# Patient Record
Sex: Female | Born: 1985 | Race: White | Hispanic: No | Marital: Single | Smoking: Current every day smoker
Health system: Southern US, Community
[De-identification: ages and names within clinical notes are randomized; demographics above are authoritative.]

## PROBLEM LIST (undated history)

## (undated) HISTORY — PX: TONSILLECTOMY: SUR1361

---
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
## 2007-04-10 ENCOUNTER — Emergency Department (HOSPITAL_COMMUNITY): Admission: EM | Admit: 2007-04-10 | Discharge: 2007-04-10 | Payer: Self-pay | Admitting: Emergency Medicine

## 2009-11-10 ENCOUNTER — Emergency Department (HOSPITAL_COMMUNITY): Admission: EM | Admit: 2009-11-10 | Discharge: 2009-11-10 | Payer: Self-pay | Admitting: Emergency Medicine

## 2010-05-11 ENCOUNTER — Emergency Department (HOSPITAL_COMMUNITY)
Admission: EM | Admit: 2010-05-11 | Discharge: 2010-05-11 | Disposition: A | Discharge status: Home / Self Care | Payer: Self-pay | Attending: Emergency Medicine | Admitting: Emergency Medicine

## 2010-05-11 ENCOUNTER — Emergency Department (HOSPITAL_COMMUNITY): Payer: Self-pay

## 2010-05-11 DIAGNOSIS — Y92009 Unspecified place in unspecified non-institutional (private) residence as the place of occurrence of the external cause: Secondary | ICD-10-CM | POA: Insufficient documentation

## 2010-05-11 DIAGNOSIS — M25529 Pain in unspecified elbow: Secondary | ICD-10-CM | POA: Insufficient documentation

## 2010-05-11 DIAGNOSIS — M25429 Effusion, unspecified elbow: Secondary | ICD-10-CM | POA: Insufficient documentation

## 2010-05-11 DIAGNOSIS — S52123A Displaced fracture of head of unspecified radius, initial encounter for closed fracture: Secondary | ICD-10-CM | POA: Insufficient documentation

## 2010-05-11 DIAGNOSIS — X500XXA Overexertion from strenuous movement or load, initial encounter: Secondary | ICD-10-CM | POA: Insufficient documentation

## 2011-10-10 IMAGING — CR DG ELBOW COMPLETE 3+V*R*
4 series · 4 of 4 positions shown · non-contrast
Comparison: None.

CLINICAL DATA: Trauma to right elbow.  Pain.  Remote history of
fracture.

RIGHT ELBOW - COMPLETE 3+ VIEW

[x elbow joint ap right]
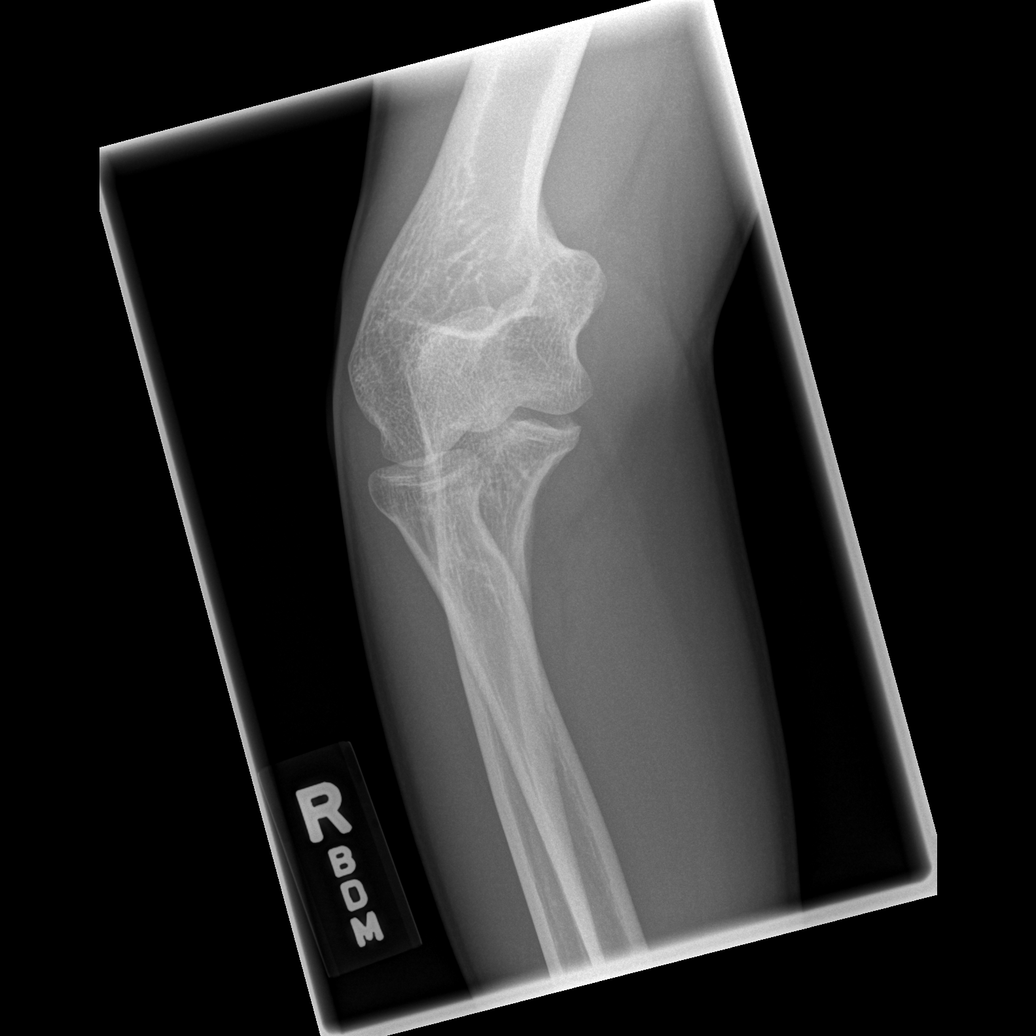

[x elbow joint obl. right (1 of 2)]
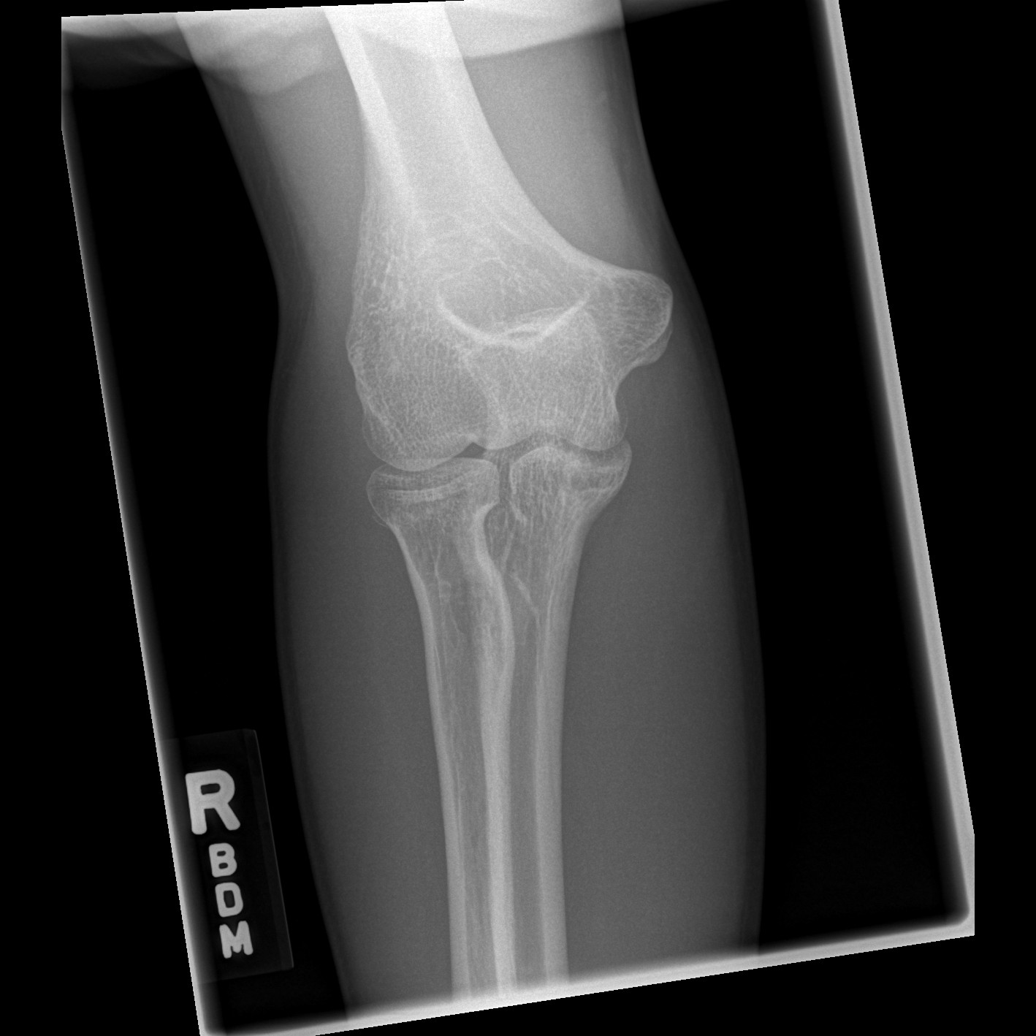

[x elbow joint obl. right (2 of 2)]
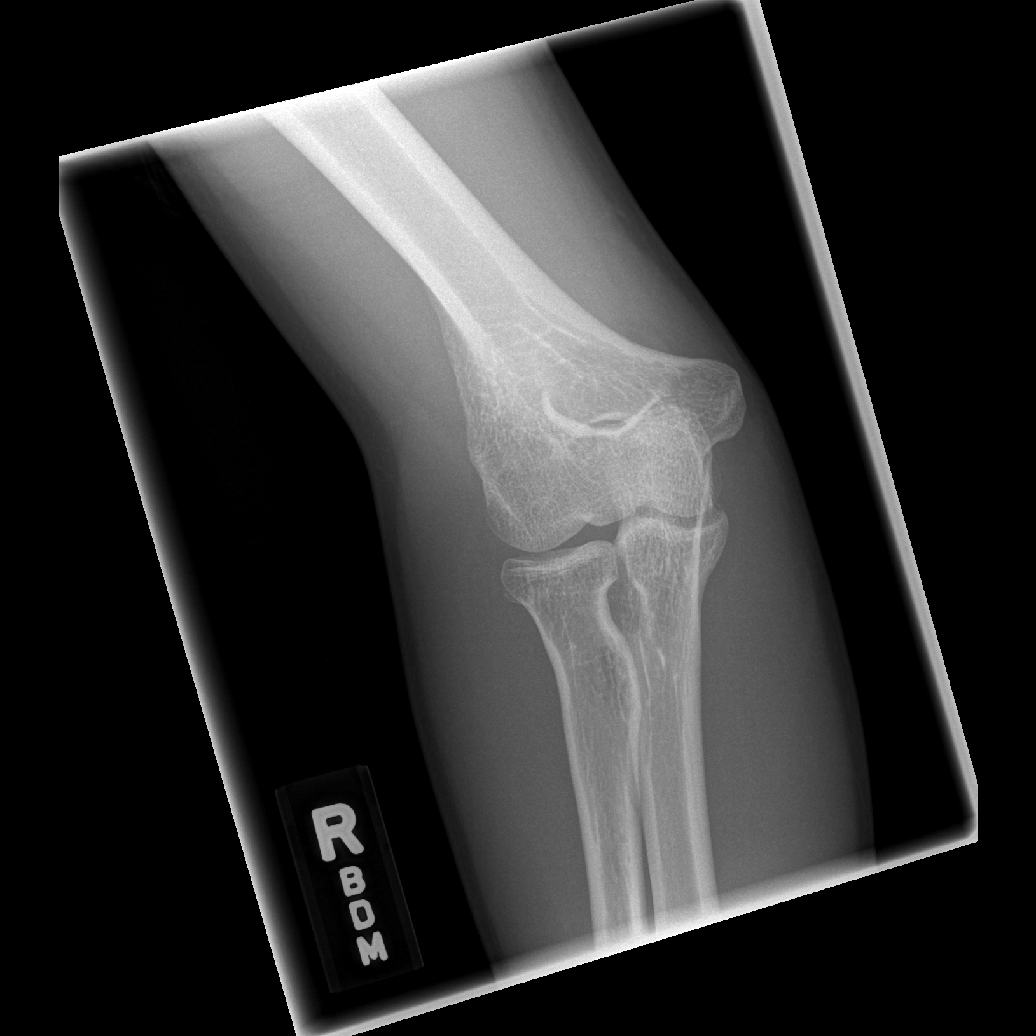

[x elbow joint lat right]
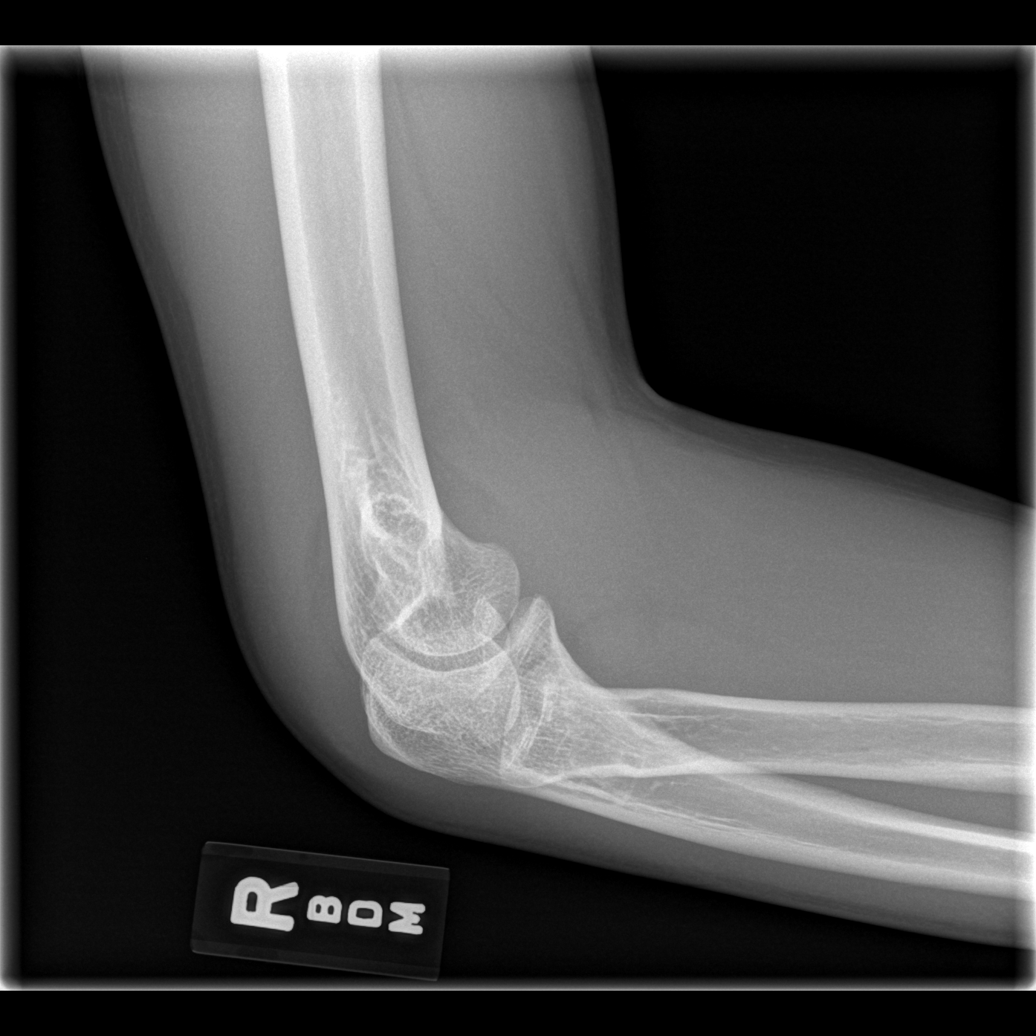

[4 of 4 positions shown; findings below may reference images not displayed]

FINDINGS: Soft tissue swelling is present.  Dorsal to the distal
humerus.  An elbow effusion is present.  A minimally-displaced
radial head fracture is suspected.
IMPRESSION: 1.  Suspect minimally-displaced radial head fracture.
2.  Moderate joint effusion.

## 2011-11-16 ENCOUNTER — Emergency Department (HOSPITAL_COMMUNITY)
Admission: EM | Admit: 2011-11-16 | Discharge: 2011-11-16 | Disposition: A | Discharge status: Home / Self Care | Payer: BC Managed Care – PPO | Source: Home / Self Care | Attending: Family Medicine | Admitting: Family Medicine

## 2011-11-16 ENCOUNTER — Encounter (HOSPITAL_COMMUNITY): Payer: Self-pay | Admitting: Emergency Medicine

## 2011-11-16 DIAGNOSIS — L02212 Cutaneous abscess of back [any part, except buttock]: Secondary | ICD-10-CM

## 2011-11-16 DIAGNOSIS — L02219 Cutaneous abscess of trunk, unspecified: Secondary | ICD-10-CM

## 2011-11-16 MED ORDER — IBUPROFEN 600 MG PO TABS: 600.0000 mg | ORAL_TABLET | Freq: Three times a day (TID) | ORAL | Status: AC

## 2011-11-16 MED ORDER — DOXYCYCLINE HYCLATE 100 MG PO CAPS: 100.0000 mg | ORAL_CAPSULE | Freq: Two times a day (BID) | ORAL | Status: AC

## 2011-11-16 NOTE — ED Provider Notes (Signed)
History     CSN: [id number]  Arrival date & time 11/16/11  1154   First MD Initiated Contact with Patient 11/16/11 1237      Chief Complaint  Patient presents with  . Insect Bite    lower back    (Consider location/radiation/quality/duration/timing/severity/associated sxs/prior treatment) HPI Comments: 26 year old female smoker nondiabetic. Here complaining of an insect bite in her left lower back over 2 weeks ago, patient has been frequently scratching and the afected area has progressively become tender and swollen. Denies fever or chills. No spontaneous drainage.   History reviewed. No pertinent past medical history.  Past Surgical History  Procedure Date  . Tonsillectomy     History reviewed. No pertinent family history.  History  Substance Use Topics  . Smoking status: Current Every Day Smoker -- 1.0 packs/day  . Smokeless tobacco: Not on file  . Alcohol Use: Yes     occaisional    OB History    Grav Para Term Preterm Abortions TAB SAB Ect Mult Living                  Review of Systems  Constitutional: Negative for fever and chills.       10 systems reviewed and  pertinent negative and positive symptoms are as per HPI.     Skin:       As per HPI  All other systems reviewed and are negative.    Allergies  Review of patient's allergies indicates no known allergies.  Home Medications   Current Outpatient Rx  Name Route Sig Dispense Refill  . DOXYCYCLINE HYCLATE 100 MG PO CAPS Oral Take 1 capsule (100 mg total) by mouth 2 (two) times daily. 20 capsule 0  . IBUPROFEN 600 MG PO TABS Oral Take 1 tablet (600 mg total) by mouth 3 (three) times daily. 20 tablet 0    BP 103/70  Pulse 77  Temp 99.1 F (37.3 C) (Oral)  Resp 16  SpO2 100%  LMP 11/15/2011  Physical Exam  Nursing note and vitals reviewed. Constitutional: She is oriented to person, place, and time. She appears well-developed and well-nourished. No distress.  HENT:  Head: Normocephalic  and atraumatic.  Eyes: Conjunctivae normal are normal.  Neck: Neck supple.  Cardiovascular: Normal rate, regular rhythm and normal heart sounds.   Pulmonary/Chest: Breath sounds normal.  Lymphadenopathy:    She has no cervical adenopathy.  Neurological: She is alert and oriented to person, place, and time.  Skin: She is not diaphoretic.       Round area of about 3 cm diameter with focal erythema, tenderness, induration and swelling at left lower back above left gluteal area. No significant fluctuation. No opening, pustules or spontaneous drainage. No significant associated cellulitis.    ED Course  INCISION AND DRAINAGE Performed by: Sharin Grave Authorized by: Sharin Grave Consent: Verbal consent obtained. Risks and benefits: risks, benefits and alternatives were discussed Consent given by: patient Patient understanding: patient states understanding of the procedure being performed Patient consent: the patient's understanding of the procedure matches consent given Type: abscess Body area: trunk Location details: back Anesthesia: local infiltration Local anesthetic: lidocaine 1% with epinephrine Scalpel size: 11 Incision type: single straight Complexity: simple Drainage: purulent Drainage amount: scant Patient tolerance: Patient tolerated the procedure well with no immediate complications. Comments: Antibiotic ointment and dry dressing applied. Wound culture pending   (including critical care time)   Labs Reviewed  CULTURE, ROUTINE-ABSCESS   No results found.   1. Abscess  of lower back       MDM  Small skin abscess in the right lower back status post incision and drainage today. Wound care instructions discussed with patient and provided in writing. Prescribe doxycycline and ibuprofen. Wound culture pending. Asked to return if persistent or worsening symptoms despite following treatment.        Sharin Grave, MD 11/19/11 [phone]

## 2011-11-16 NOTE — ED Notes (Signed)
Pt c/o insect bite to lower back noticed on Aug. 24 area had healed at one point but then it started to itch and cut area open with scratching.  Pt states red,hard,and swollen.

## 2011-11-19 LAB — CULTURE, ROUTINE-ABSCESS

## 2011-11-21 ENCOUNTER — Telehealth (HOSPITAL_COMMUNITY): Payer: Self-pay | Admitting: *Deleted

## 2011-11-21 NOTE — ED Notes (Signed)
Pt. called back.  Pt. verified x 2 and given results.  Pt. told she was adequately treated with Doxycycline.  Pt. given MRSA instructions.  Pt.'s questions answered. (No she does not have to tell her employer.  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

## 2011-11-21 NOTE — ED Notes (Signed)
Abscess culture L lower back: Mod. MRSA.  Pt. adequately treated with Doxycycline. I called and left a message for pt. to call. Vassie Moselle 11/21/2011

## 2011-12-26 ENCOUNTER — Emergency Department (HOSPITAL_COMMUNITY)
Admission: EM | Admit: 2011-12-26 | Discharge: 2011-12-26 | Disposition: A | Discharge status: Home / Self Care | Payer: BC Managed Care – PPO | Source: Home / Self Care

## 2011-12-26 ENCOUNTER — Encounter (HOSPITAL_COMMUNITY): Payer: Self-pay | Admitting: Emergency Medicine

## 2011-12-26 DIAGNOSIS — J029 Acute pharyngitis, unspecified: Secondary | ICD-10-CM

## 2011-12-26 DIAGNOSIS — B349 Viral infection, unspecified: Secondary | ICD-10-CM

## 2011-12-26 DIAGNOSIS — J069 Acute upper respiratory infection, unspecified: Secondary | ICD-10-CM

## 2011-12-26 DIAGNOSIS — B9789 Other viral agents as the cause of diseases classified elsewhere: Secondary | ICD-10-CM

## 2011-12-26 NOTE — ED Provider Notes (Signed)
History     CSN: [id number]  Arrival date & time 12/26/11  6962   None     Chief Complaint  Patient presents with  . Sore Throat    (Consider location/radiation/quality/duration/timing/severity/associated sxs/prior treatment) HPI Comments: Refill for presents with sore throat progressing over 2 days. He started at a scratchy throat in turning to the painful throat. Is associated with nasal congestion and having to blow her nose frequently. She has a sensation of her ears popping associated with of nasal congestion. She also has nausea but no vomiting and a decrease in appetite.  Patient is a 26 y.o. female presenting with pharyngitis. The history is provided by the patient.  Sore Throat This is a new problem. The current episode started 2 days ago. The problem occurs constantly. The problem has been gradually worsening. Pertinent negatives include no chest pain, no abdominal pain, no headaches and no shortness of breath. The symptoms are relieved by NSAIDs.    History reviewed. No pertinent past medical history.  Past Surgical History  Procedure Date  . Tonsillectomy     No family history on file.  History  Substance Use Topics  . Smoking status: Current Every Day Smoker -- 0.5 packs/day  . Smokeless tobacco: Not on file  . Alcohol Use: Yes     occaisional    OB History    Grav Para Term Preterm Abortions TAB SAB Ect Mult Living                  Review of Systems  Constitutional: Negative for fever, chills, activity change, appetite change and fatigue.  HENT: Positive for congestion, sore throat, rhinorrhea and postnasal drip. Negative for facial swelling, neck pain, neck stiffness and ear discharge.   Eyes: Negative.  Negative for pain.  Respiratory: Positive for cough. Negative for chest tightness and shortness of breath.   Cardiovascular: Negative.  Negative for chest pain.  Gastrointestinal: Positive for nausea. Negative for abdominal pain.  Genitourinary:  Negative.   Skin: Negative for pallor and rash.  Neurological: Negative.  Negative for headaches.    Allergies  Review of patient's allergies indicates no known allergies.  Home Medications  No current outpatient prescriptions on file.  BP 116/67  Pulse 109  Temp 100.7 F (38.2 C) (Oral)  Resp 18  SpO2 96%  LMP 12/17/2011  Physical Exam  Constitutional: She is oriented to person, place, and time. She appears well-developed and well-nourished. No distress.  HENT:  Right Ear: External ear normal.  Left Ear: External ear normal.       Oropharynx is erythematous with cobblestoning. No exudates observed  Eyes: Conjunctivae normal and EOM are normal.  Neck: Normal range of motion. Neck supple.  Cardiovascular: Normal rate and regular rhythm.   Pulmonary/Chest: Effort normal and breath sounds normal. No respiratory distress. She has no wheezes. She has no rales.  Musculoskeletal: Normal range of motion. She exhibits no edema.  Lymphadenopathy:    She has no cervical adenopathy.  Neurological: She is alert and oriented to person, place, and time.  Skin: Skin is warm and dry. No rash noted.  Psychiatric: She has a normal mood and affect.    ED Course  Procedures (including critical care time)   Labs Reviewed  POCT RAPID STREP A ([facility] ONLY)   No results found.   1. Acute viral pharyngitis   2. Viral syndrome   3. URI (upper respiratory infection)       MDM  Results for orders placed during the hospital encounter of 12/26/11  POCT RAPID STREP A ([facility] ONLY)      Component Value Range   Streptococcus, Group A Screen (Direct) NEGATIVE  NEGATIVE   Drink plenty of fluids and stay well hydrated Ibuprofen 600-800 mg Q8 hours when necessary sore throat pain and fever. Cepacol lozenges when necessary sore throat For any worsening new symptoms or problems may return or follow up with your PCP.        Hayden Rasmussen, NP 12/26/11 [phone]

## 2011-12-26 NOTE — ED Notes (Addendum)
Pt c/o cold sx x2 days.... Sx include: sore throat, body aches, fevers, nauseas, dry cough, dysphagia... Denies: vomiting, diarrhea.... Pt is alert w/no signs of distress and sitting up straight on the exam table.

## 2011-12-27 NOTE — ED Provider Notes (Signed)
Medical screening examination/treatment/procedure(s) were performed by resident physician or non-physician practitioner and as supervising physician I was immediately available for consultation/collaboration.   Barkley Bruns MD.    Linna Hoff, MD 12/27/11 2107

## 2013-05-29 ENCOUNTER — Ambulatory Visit (INDEPENDENT_AMBULATORY_CARE_PROVIDER_SITE_OTHER): Payer: BC Managed Care – PPO | Admitting: Surgery

## 2013-05-29 ENCOUNTER — Telehealth (INDEPENDENT_AMBULATORY_CARE_PROVIDER_SITE_OTHER): Payer: Self-pay | Admitting: Surgery

## 2013-05-29 ENCOUNTER — Encounter (INDEPENDENT_AMBULATORY_CARE_PROVIDER_SITE_OTHER): Payer: Self-pay | Admitting: Surgery

## 2013-05-29 VITALS — BP 124/74 | HR 77 | Temp 98.7°F | Resp 16 | Ht 69.0 in | Wt 138.4 lb

## 2013-05-29 DIAGNOSIS — M7989 Other specified soft tissue disorders: Secondary | ICD-10-CM | POA: Insufficient documentation

## 2013-05-29 DIAGNOSIS — M799 Soft tissue disorder, unspecified: Secondary | ICD-10-CM

## 2013-05-29 NOTE — Progress Notes (Signed)
General Surgery [facility]  Chief Complaint  Patient presents with  . New Evaluation    possible lipoma on right inner thigh - referral from Dr. Teodora MediciHoward Mezer    HISTORY: Patient is a 28 year old female referred by her gynecologist for evaluation of a right thigh mass. Patient has a soft tissue mass at the upper portion of the right medial thigh which has been present for approximately 3 years. It has not changed significantly in size. It does not cause any significant discomfort. There is been no signs or symptoms of infection. Patient presents today for evaluation and wishes surgical removal.  Patient has had no other such lesions removed. She denies any history of trauma.  History reviewed. No pertinent past medical history.  No current outpatient prescriptions on file.   No current facility-administered medications for this visit.    No Known Allergies  History reviewed. No pertinent family history.  History   Social History  . Marital Status: Single    Spouse Name: N/A    Number of Children: N/A  . Years of Education: N/A   Social History Main Topics  . Smoking status: Current Every Day Smoker -- 0.50 packs/day  . Smokeless tobacco: None  . Alcohol Use: Yes     Comment: occaisional  . Drug Use: No  . Sexual Activity: Yes    Birth Control/ Protection: Condom   Other Topics Concern  . None   Social History Narrative  . None    REVIEW OF SYSTEMS - PERTINENT POSITIVES ONLY: No pain. No signs or symptoms of infection. No other similar lesions.  EXAM: Filed Vitals:   05/29/13 1415  BP: 124/74  Pulse: 77  Temp: 98.7 F (37.1 C)  Resp: 16    GENERAL: well-developed, well-nourished, no acute distress HEENT: normocephalic; pupils equal and reactive; sclerae clear; dentition good; mucous membranes moist NECK:  symmetric on extension; no palpable anterior or posterior cervical lymphadenopathy; no supraclavicular masses; no  tenderness CHEST: clear to auscultation bilaterally without rales, rhonchi, or wheezes CARDIAC: regular rate and rhythm without significant murmur; peripheral pulses are full EXT:  non-tender without edema; no deformity; soft tissue mass, 4 cm x 2 cm x 2 cm, upper right medial thigh, mobile, firm NEURO: no gross focal deficits; no sign of tremor   LABORATORY RESULTS: See [facility] ([facility]) for most recent results  RADIOLOGY RESULTS: See [facility] ([facility]pic) for most recent results  IMPRESSION: 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 I discussed surgical excision. We discussed the risk and benefits of the procedure and the postoperative wound care. She understands and wishes to proceed with outpatient surgery in the near future.  The risks and benefits of the procedure have been discussed at length with the patient.  The patient understands the proposed procedure, potential alternative treatments, and the course of recovery to be expected.  All of the patient's questions have been answered at this time.  The patient wishes to proceed with surgery.  Velora Hecklerodd M. Cory Rama, MD, FACS General & Endocrine Surgery [facility]  Primary Care Physician: No primary provider on file.

## 2013-05-29 NOTE — Patient Instructions (Signed)

## 2013-05-29 NOTE — Telephone Encounter (Signed)
Patient met with surgery scheduling given financial responsibilities, patient will call back to schedule °

## 2013-09-20 ENCOUNTER — Emergency Department (HOSPITAL_COMMUNITY)
Admission: EM | Admit: 2013-09-20 | Discharge: 2013-09-20 | Disposition: A | Discharge status: Home / Self Care | Payer: BC Managed Care – PPO | Attending: Emergency Medicine | Admitting: Emergency Medicine

## 2013-09-20 ENCOUNTER — Encounter (HOSPITAL_COMMUNITY): Payer: Self-pay | Admitting: Emergency Medicine

## 2013-09-20 DIAGNOSIS — W269XXA Contact with unspecified sharp object(s), initial encounter: Secondary | ICD-10-CM | POA: Insufficient documentation

## 2013-09-20 DIAGNOSIS — Z5189 Encounter for other specified aftercare: Secondary | ICD-10-CM

## 2013-09-20 DIAGNOSIS — S61209A Unspecified open wound of unspecified finger without damage to nail, initial encounter: Secondary | ICD-10-CM | POA: Insufficient documentation

## 2013-09-20 DIAGNOSIS — Y9389 Activity, other specified: Secondary | ICD-10-CM | POA: Insufficient documentation

## 2013-09-20 DIAGNOSIS — F17200 Nicotine dependence, unspecified, uncomplicated: Secondary | ICD-10-CM | POA: Insufficient documentation

## 2013-09-20 DIAGNOSIS — Y9289 Other specified places as the place of occurrence of the external cause: Secondary | ICD-10-CM | POA: Insufficient documentation

## 2013-09-20 MED ORDER — NAPROXEN 500 MG PO TABS: 500.0000 mg | ORAL_TABLET | Freq: Two times a day (BID) | ORAL | Status: AC

## 2013-09-20 MED ORDER — CEPHALEXIN 500 MG PO CAPS: 500.0000 mg | ORAL_CAPSULE | Freq: Three times a day (TID) | ORAL | Status: AC

## 2013-09-20 NOTE — ED Provider Notes (Signed)
CSN: [id number]     Arrival date & time 09/20/13  1704 History  This chart was scribed for non-physician practitioner working with Hurman Horn, MD by Elveria Rising, ED Scribe. This patient was seen in room TR08C/TR08C and the patient's care was started at 5:48 PM.   Chief Complaint  Patient presents with  . Wound Infection     HPI Comments: Jaclyn DONALSON is a 28 y.o. female who presents to the Emergency Department with small laceration to left fourth finger, incurred five days ago. 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 Patient is unsure of when the laceration was sustained with or without her gloves. Since cutting her finger, patient reports throbbing pain and swelling at the site and is concerned about infection. Last night patient reports that the throbbing pain was so severe she was awoken from her sleep. She reports treatment migraine medication at the time to relieve throbbing. Patient reports presentation of bruising today. Denies fever or chills, discharge from the wound. No PCP currently, but patient is actively searching.   The history is provided by the patient. No language interpreter was used.    History reviewed. No pertinent past medical history. Past Surgical History  Procedure Laterality Date  . Tonsillectomy     History reviewed. No pertinent family history. History  Substance Use Topics  . Smoking status: Current Every Day Smoker -- 0.50 packs/day    Types: Cigarettes  . Smokeless tobacco: Not on file  . Alcohol Use: Yes     Comment: occaisional   OB History   Grav Para Term Preterm Abortions TAB SAB Ect Mult Living                 Review of Systems  Constitutional: Negative for fever and chills.  Gastrointestinal: Negative for nausea, vomiting and diarrhea.  Musculoskeletal: Positive for arthralgias.  Skin: Positive for wound.  Neurological: Negative for weakness and numbness.      Allergies   Review of patient's allergies indicates no known allergies.  Home Medications   Prior to Admission medications   Not on File   Triage Vitals: BP 112/74  Pulse 81  Temp(Src) 98.8 F (37.1 C) (Oral)  Resp 16  Ht 5\' 10"  (1.778 m)  Wt 134 lb 3.2 oz (60.873 kg)  BMI 19.26 kg/m2  SpO2 99% Physical Exam  Nursing note and vitals reviewed. Constitutional: She is oriented to person, place, and time. She appears well-developed and well-nourished.  Non-toxic appearance. She does not have a sickly appearance. She does not appear ill. No distress.  HENT:  Head: Normocephalic and atraumatic.  Eyes: EOM are normal.  Neck: Neck supple.  Cardiovascular: Normal rate.   Pulmonary/Chest: Effort normal.  Musculoskeletal: Normal range of motion. She exhibits tenderness.       Left hand: She exhibits tenderness and laceration. She exhibits normal capillary refill.  Left hand: Approximately 1 cm laceration to the PIP of the fourth digit. Moderate tenderness to palpation to the PIP no obvious deformity. Mild ecchymosis. Mild swelling and erythema overlying the proximal phalanges. No tenderness to palpation to the proximal tendon sheath to palm, or crepitus or overlying erythema.  Neurological: She is alert and oriented to person, place, and time.  Skin: Skin is warm and dry. She is not diaphoretic.  Psychiatric: She has a normal mood and affect. Her behavior is normal.    ED Course  Procedures (including critical care time) COORDINATION OF CARE: 5:53 PM-  Discussed treatment plan with patient at bedside and patient agreed to plan.   Labs Review Labs Reviewed - No data to display  Imaging Review No results found.   EKG Interpretation None      MDM   Final diagnoses:  Visit for wound check   Patient presents with a small laceration sustained several days ago. Mild erythema and swelling to the proximal phalange she concerning for early cellulitis. No obvious abscess or pus from wound. No  signs of tenosynovitis on exam.  Will discharge with keflex, hand follow up as needed, resources given. Discussed treatment plan with the patient. Return precautions given. Reports understanding and no other concerns at this time.  Patient is stable for discharge at this time.   Meds given in ED:  Medications - No data to display  Discharge Medication List as of 09/20/2013  6:35 PM    START taking these medications   Details  cephALEXin (KEFLEX) 500 MG capsule Take 1 capsule (500 mg total) by mouth 3 (three) times daily., Starting 09/20/2013, Until Discontinued, Print    naproxen (NAPROSYN) 500 MG tablet Take 1 tablet (500 mg total) by mouth 2 (two) times daily., Starting 09/20/2013, Until Discontinued, Print        I personally performed the services described in this documentation, which was scribed in my presence. The recorded information has been reviewed and is accurate.    Clabe SealLauren M Romario Tith, PA-C 09/21/13 [phone]

## 2013-09-20 NOTE — Discharge Instructions (Signed)
Call for a follow up appointment with a Family or Primary Care Provider.  Return to the Emergency Department if symptoms worsen, you develop fever or chills, worsening redness at the site.   Take medication as prescribed.  Keep your wound clean and dry. You can soak the wound in warm and dial soap 2-3 times a day.  Emergency Department Resource Guide 1) Find a Doctor and Pay Out of Pocket Although you won't have to find out who is covered by your insurance plan, it is a good idea to ask around and get recommendations. You will then need to call the office and see if the doctor you have chosen will accept you as a new patient and what types of options they offer for patients who are self-pay. Some doctors offer discounts or will set up payment plans for their patients who do not have insurance, but you will need to ask so you aren't surprised when you get to your appointment.  2) Contact Your Local Health Department Not all health departments have doctors that can see patients for sick visits, but many do, so it is worth a call to see if yours does. If you don't know where your local health department is, you can check in your phone book. The CDC also has a tool to help you locate your state's health department, and many state websites also have listings of all of their local health departments.  3) Find a Walk-in Clinic If your illness is not likely to be very severe or complicated, you may want to try a walk in clinic. These are popping up all over the country in pharmacies, drugstores, and shopping centers. They're usually staffed by nurse practitioners or physician assistants that have been trained to treat common illnesses and complaints. They're usually fairly quick and inexpensive. However, if you have serious medical issues or chronic medical problems, these are probably not your best option.  No Primary Care Doctor: - Call Health Connect at  [phone] - they can help you locate a primary care  doctor that  accepts your insurance, provides certain services, etc. - Physician Referral Service- [phone]  Chronic Pain Problems: Organization         Address  Phone   Notes  [facility]  [phone] Patients need to be referred by their primary care doctor.   Medication Assistance: Organization         Address  Phone   Notes  [facility] [address] [place], [place] [address] [phone] --Must be a resident of [facility] -- Must have NO insurance coverage whatsoever (no Medicaid/ Medicare, etc.) -- The pt. MUST have a primary care doctor that directs their care regularly and follows them in the community   MedAssist  [phone]) [phone]   [employer]  [phone]    Agencies that provide inexpensive medical care: Organization         Address  Phone   Notes  [facility]  [phone]   [facility]    [phone]) [phone]   [facility] [address] [place], [place] [address] [phone]) [phone]   [facility] [address], [place] [phone]   [facility]anned Parenthood    [phone]   [facility]    [phone]   [facility]  [address], [place] Phone:  [phone], Fax:  [phone] Hours of Operation:  9 am - 6 pm, M-F.  Also accepts Medicaid/Medicare and self-pay.  [facility] for [place], [address], [place] Phone: [phone], Fax: [phone]. Hours of Operation:  8:30 am - 5:30 pm, M-F.  Also accepts Medicaid and self-pay.  [facility] [address] [place] Phone: [phone]   [place], [place], [place] [phone], Ext. [phone] Mondays & Thursdays: 7-9 AM.  First 15 patients are seen on a first come, first serve basis.    [place]  Providers:  Organization         Address  Phone   Notes  [facility] [address], [place] [phone] Also accepts self-pay patients.  [facility] [address], [place]  [phone]   [place], [address], [place] [phone]   [facility] [address], [place] [phone]   Lucianne Lei [address], [place]   [phone] Only accepts [place] Access [place] patients after they have their name applied to their card.   Self-Pay (no insurance) in [facility]:  Organization         Address  Phone   Notes  Sickle Cell Patients, [facility] [place] [phone]   [facility] [place] [phone]   [facility] [place]  [place], [place], [place] [phone]   [facility]/Dr. Osei-Bonsu  [address], [place] or [address], [place] [phone] Phone number for both [place] and [place] locations is the same.  Urgent Medical and [facility] [address], [place] [phone]   [facility] [address] [place] or [address] [phone]   [facility] [address], [place] [phone], phone; [phone], fax Sees patients 1st and 3rd Saturday of every month.  Must not qualify for public or private insurance (i.e. Medicaid, Medicare, [place] Health Choice, Veterans' Benefits)  Household income should be no more than 200% of the poverty level The clinic cannot treat you if you are pregnant or think you are pregnant  Sexually transmitted diseases are not treated at the clinic.    Dental Care: Organization         Address  Phone  Notes  [facility] Department of [facility] [place] [phone] Accepts children up to age 38 who are enrolled in [place] or [place]; pregnant women with a Medicaid card; and children who have applied for Medicaid or [place] Health Choice, but were declined, whose parents can pay a reduced fee at time of service.  [facility] Department of [facility]  [address], [place] [phone] Accepts children up to age 17 who are enrolled in [place] or [place]; pregnant women with a Medicaid card; and children who have applied for Medicaid or [place] Health Choice, but were declined, whose parents can pay a reduced fee at time of service.  [facility] PRO[facility]  [place] [phone] Patients are seen by appointment only. Walk-ins are not accepted. [facility] will see patients 74 years of age and older. Monday - Tuesday (8am-5pm) Most Wednesdays (8:30-5pm) $30 per visit, cash only  [facility] Adult Dental Access PROGRAM  [address], [facility] [phone] Patients are seen by appointment only. Walk-ins are not accepted. [place] will see patients 50 years of age and older. One Wednesday Evening (Monthly: Volunteer Based).  $30 per visit, cash only  [place]  [phone] for adults; Children under age 23, call Graduate Pediatric Dentistry at [phone]. Children aged 20-14, please call [phone] to request a pediatric application.  Dental services are provided in all areas of dental care including fillings, crowns and bridges, complete and partial dentures, implants, gum treatment, root canals, and extractions. Preventive care is also provided. Treatment is provided to both adults and children. Patients are selected via a lottery and there is often a waiting list.   [facility] [address], [place]  [phone] [email or web address]   [facility]  [address] [place], [place] [phone], Ext. [phone] Second and Fourth Thursday of each month, opens at 6:30 AM; Clinic ends at 9 AM.  Patients are seen on a first-come first-served basis, and a limited number are seen during each clinic.   [facility]  [address] Hillard Danker [place], [place] [phone]   Eligibility Requirements You must have lived in [place], [place], or [place] counties for at least the last three months.   You cannot be eligible for state or federal sponsored [employer], including [employer], [place], or [employer].   You generally cannot be eligible for healthcare insurance through your employer.    How to apply: Eligibility screenings are held every Tuesday and Wednesday afternoon from 1:00 pm until 4:00 pm. You do not need an appointment for the interview!  [facility] [address], [place], [place]   [place]  [place] Department  [place]  [phone]    Behavioral Health Resources in the Community: Intensive Outpatient Programs Organization         Address  Phone  Notes  [place]. [address] [place], [place] [phone]   [facility] Outpatient [address] [place], [place]   ADS: Alcohol & Drug S[facility] [address], [place], [place]   [place] [address]  [place], [place] or [phone]   Substance Abuse Resources Organization         Address  Phone  Notes  Alcohol and Drug Services  [phone]   [place]  [phone]   [facility] [place]   Chinita Pester  [phone]   Residential & Outpatient Substance Abuse Program  [phone]   Psychological Services Organization         Address  Phone  Notes  [facility] [place]  [place]  [phone]   [place] [address] [place] or [phone]    Mobile Crisis Teams Organization         Address  Phone  Notes  [facility], Mobile Crisis Care Unit  [phone]   [facility]  [address] [place], [place]   [facility] [address] [place] [phone]    Self-Help/Support Groups Organization  Address  Phone             Notes  [place]. of [place] - variety of support groups  [place] Call for more information  Narcotics Anonymous (NA), [facility] [address], [employer] [place]  2 meetings at this location   Special educational needs teacher         Address  Phone  Notes  [facility] Residential Treatment [place],    [place]  [phone]   [facility]  [address], [place] [address]9924, [place], [place]   [place], [place] [phone] Admissions: 8am-3pm M-F  Incentives Substance [place] [address],    [place], [place] [phone]   [facility] [address] [place], [place], [place]   [facility] [address],  [place], [place]   [facility] - Intensive Outpatient [address], [place], [place]   [facility] ([place] [address] [address]  [place], [place] [phone] or [phone]   [facility] Services (RTS) [address] [place], [place] Accepts Medicaid  [facility] [place] [address]  [place] [phone] Substance Abuse/Addiction Treatment   [facility] Organization         Address  Phone  Notes  [facility]  [phone]   Domenic Schwab, PhD [address] Arlis Porta [place], [place]   [phone] or [phone]    [place], [place] [phone]   [facility] [address], [place], [place] [phone] Insurance/Medicaid/sponsorship through [facility] and Families [address], [address]                                    [place], [place] [phone] [place] [address], [place] [phone]    Dr. Adele Schilder  [phone]   [facility] Dept. 1) [address], [place] 2) [place] 3)  [place] [address], [place] [phone]  [phone]   [place] [phone] or [phone] (After Hours)

## 2013-09-20 NOTE — ED Notes (Signed)
She cut her L 4th digit Monday and shes had pain and swelling at site since. She is concerned for infection.

## 2013-09-21 NOTE — ED Provider Notes (Signed)
Medical screening examination/treatment/procedure(s) were performed by non-physician practitioner and as supervising physician I was immediately available for consultation/collaboration.   EKG Interpretation None       Hurman HornJohn M Domonic Kimball, MD 09/21/13 1320

## 2014-11-19 ENCOUNTER — Emergency Department (HOSPITAL_COMMUNITY)
Admission: EM | Admit: 2014-11-19 | Discharge: 2014-11-19 | Disposition: A | Discharge status: Home / Self Care | Payer: BLUE CROSS/BLUE SHIELD | Attending: Emergency Medicine | Admitting: Emergency Medicine

## 2014-11-19 ENCOUNTER — Encounter (HOSPITAL_COMMUNITY): Payer: Self-pay | Admitting: Emergency Medicine

## 2014-11-19 DIAGNOSIS — B379 Candidiasis, unspecified: Secondary | ICD-10-CM

## 2014-11-19 DIAGNOSIS — Z79899 Other long term (current) drug therapy: Secondary | ICD-10-CM | POA: Insufficient documentation

## 2014-11-19 DIAGNOSIS — Z720 Tobacco use: Secondary | ICD-10-CM | POA: Insufficient documentation

## 2014-11-19 DIAGNOSIS — W57XXXA Bitten or stung by nonvenomous insect and other nonvenomous arthropods, initial encounter: Secondary | ICD-10-CM | POA: Insufficient documentation

## 2014-11-19 DIAGNOSIS — Y9289 Other specified places as the place of occurrence of the external cause: Secondary | ICD-10-CM | POA: Insufficient documentation

## 2014-11-19 DIAGNOSIS — Z3202 Encounter for pregnancy test, result negative: Secondary | ICD-10-CM | POA: Diagnosis not present

## 2014-11-19 DIAGNOSIS — Y9389 Activity, other specified: Secondary | ICD-10-CM | POA: Diagnosis not present

## 2014-11-19 DIAGNOSIS — S80869A Insect bite (nonvenomous), unspecified lower leg, initial encounter: Secondary | ICD-10-CM | POA: Insufficient documentation

## 2014-11-19 DIAGNOSIS — A5901 Trichomonal vulvovaginitis: Secondary | ICD-10-CM | POA: Diagnosis not present

## 2014-11-19 DIAGNOSIS — Y998 Other external cause status: Secondary | ICD-10-CM | POA: Diagnosis not present

## 2014-11-19 DIAGNOSIS — S0086XA Insect bite (nonvenomous) of other part of head, initial encounter: Secondary | ICD-10-CM | POA: Diagnosis not present

## 2014-11-19 LAB — PREGNANCY, URINE: Preg Test, Ur: NEGATIVE

## 2014-11-19 LAB — URINALYSIS, ROUTINE W REFLEX MICROSCOPIC
BILIRUBIN URINE: NEGATIVE
Glucose, UA: NEGATIVE mg/dL
HGB URINE DIPSTICK: NEGATIVE
KETONES UR: NEGATIVE mg/dL
Leukocytes, UA: NEGATIVE
NITRITE: NEGATIVE
PH: 6 (ref 5.0–8.0)
Protein, ur: NEGATIVE mg/dL
Specific Gravity, Urine: 1.018 (ref 1.005–1.030)
UROBILINOGEN UA: 1 mg/dL (ref 0.0–1.0)

## 2014-11-19 LAB — GC/CHLAMYDIA PROBE AMP (~~LOC~~) NOT AT ARMC
CHLAMYDIA, DNA PROBE: NEGATIVE
Neisseria Gonorrhea: NEGATIVE

## 2014-11-19 LAB — WET PREP, GENITAL
Clue Cells Wet Prep HPF POC: NONE SEEN
Trich, Wet Prep: NONE SEEN

## 2014-11-19 MED ORDER — FLUCONAZOLE 100 MG PO TABS: 100.0000 mg | ORAL_TABLET | Freq: Every day | ORAL | Status: AC

## 2014-11-19 NOTE — Discharge Instructions (Signed)
Candida Infection A Candida infection (also called yeast, fungus, and Monilia infection) is an overgrowth of yeast that can occur anywhere on the body. A yeast infection commonly occurs in warm, moist body areas. Usually, the infection remains localized but can spread to become a systemic infection. A yeast infection may be a sign of a more severe disease such as diabetes, leukemia, or AIDS. A yeast infection can occur in both men and women. In women, Candida vaginitis is a vaginal infection. It is one of the most common causes of vaginitis. Men usually do not have symptoms or know they have an infection until other problems develop. Men may find out they have a yeast infection because their sex partner has a yeast infection. Uncircumcised men are more likely to get a yeast infection than circumcised men. This is because the uncircumcised glans is not exposed to air and does not remain as dry as that of a circumcised glans. Older adults may develop yeast infections around dentures. CAUSES  Women  Antibiotics.  Steroid medication taken for a long time.  Being overweight (obese).  Diabetes.  Poor immune condition.  Certain serious medical conditions.  Immune suppressive medications for organ transplant patients.  Chemotherapy.  Pregnancy.  Menstruation.  Stress and fatigue.  Intravenous drug use.  Oral contraceptives.  Wearing tight-fitting clothes in the crotch area.  Catching it from a sex partner who has a yeast infection.  Spermicide.  Intravenous, urinary, or other catheters. Men  Catching it from a sex partner who has a yeast infection.  Having oral or anal sex with a person who has the infection.  Spermicide.  Diabetes.  Antibiotics.  Poor immune system.  Medications that suppress the immune system.  Intravenous drug use.  Intravenous, urinary, or other catheters. SYMPTOMS  Women  Thick, white vaginal discharge.  Vaginal itching.  Redness and  swelling in and around the vagina.  Irritation of the lips of the vagina and perineum.  Blisters on the vaginal lips and perineum.  Painful sexual intercourse.  Low blood sugar (hypoglycemia).  Painful urination.  Bladder infections.  Intestinal problems such as constipation, indigestion, bad breath, bloating, increase in gas, diarrhea, or loose stools. Men  Men may develop intestinal problems such as constipation, indigestion, bad breath, bloating, increase in gas, diarrhea, or loose stools.  Dry, cracked skin on the penis with itching or discomfort.  Jock itch.  Dry, flaky skin.  Athlete's foot.  Hypoglycemia. DIAGNOSIS  Women  A history and an exam are performed.  The discharge may be examined under a microscope.  A culture may be taken of the discharge. Men  A history and an exam are performed.  Any discharge from the penis or areas of cracked skin will be looked at under the microscope and cultured.  Stool samples may be cultured. TREATMENT  Women  Vaginal antifungal suppositories and creams.  Medicated creams to decrease irritation and itching on the outside of the vagina.  Warm compresses to the perineal area to decrease swelling and discomfort.  Oral antifungal medications.  Medicated vaginal suppositories or cream for repeated or recurrent infections.  Wash and dry the irritation areas before applying the cream.  Eating yogurt with Lactobacillus may help with prevention and treatment.  Sometimes painting the vagina with gentian violet solution may help if creams and suppositories do not work. Men  Antifungal creams and oral antifungal medications.  Sometimes treatment must continue for 30 days after the symptoms go away to prevent recurrence. HOME CARE INSTRUCTIONS  Women  Use cotton underwear and avoid tight-fitting clothing.  Avoid colored, scented toilet paper and deodorant tampons or pads.  Do not douche.  Keep your diabetes  under control.  Finish all the prescribed medications.  Keep your skin clean and dry.  Consume milk or yogurt with Lactobacillus-active culture regularly. If you get frequent yeast infections and think that is what the infection is, there are over-the-counter medications that you can get. If the infection does not show healing in 3 days, talk to your caregiver.  Tell your sex partner you have a yeast infection. Your partner may need treatment also, especially if your infection does not clear up or recurs. Men  Keep your skin clean and dry.  Keep your diabetes under control.  Finish all prescribed medications.  Tell your sex partner that you have a yeast infection so he or she can be treated if necessary. SEEK MEDICAL CARE IF:   Your symptoms do not clear up or worsen in one week after treatment.  You have an oral temperature above 102 F (38.9 C).  You have trouble swallowing or eating for a prolonged time.  You develop blisters on and around your vagina.  You develop vaginal bleeding and it is not your menstrual period.  You develop abdominal pain.  You develop intestinal problems as mentioned above.  You get weak or light-headed.  You have painful or increased urination.  You have pain during sexual intercourse. MAKE SURE YOU:   Understand these instructions.  Will watch your condition.  Will get help right away if you are not doing well or get worse. Document Released: 03/30/2004 Document Revised: 07/07/2013 Document Reviewed: 07/12/2009 [facility] Patient Information 2015 [place], [place]. This information is not intended to replace advice given to you by your health care provider. Make sure you discuss any questions you have with your health care provider.  Insect Bite Mosquitoes, flies, fleas, bedbugs, and other insects can bite. Insect bites are different from insect stings. The bite may be red, puffy (swollen), and itchy for 2 to 4 days. Most bites get better  on their own. HOME CARE   Do not scratch the bite.  Keep the bite clean and dry. Wash the bite with soap and water.  Put ice on the bite.  Put ice in a plastic bag.  Place a towel between your skin and the bag.  Leave the ice on for 20 minutes, 4 times a day. Do this for the first 2 to 3 days, or as told by your doctor.  You may use medicated lotions or creams to lessen itching as told by your doctor.  Only take medicines as told by your doctor.  If you are given medicines (antibiotics), take them as told. Finish them even if you start to feel better. You may need a tetanus shot if:  You cannot remember when you had your last tetanus shot.  You have never had a tetanus shot.  The injury broke your skin. If you need a tetanus shot and you choose not to have one, you may get tetanus. Sickness from tetanus can be serious. GET HELP RIGHT AWAY IF:   You have more pain, redness, or puffiness.  You see a red line on the skin coming from the bite.  You have a fever.  You have joint pain.  You have a headache or neck pain.  You feel weak.  You have a rash.  You have chest pain, or you are short of breath.  You have belly (abdominal) pain.  You feel sick to your stomach (nauseous) or throw up (vomit).  You feel very tired or sleepy. MAKE SURE YOU:   Understand these instructions.  Will watch your condition.  Will get help right away if you are not doing well or get worse. Document Released: 02/18/2000 Document Revised: 05/15/2011 Document Reviewed: 09/21/2010 [facility] Patient Information 2015 [place], [place]. This information is not intended to replace advice given to you by your health care provider. Make sure you discuss any questions you have with your health care provider.

## 2014-11-19 NOTE — ED Provider Notes (Signed)
CSN: [id number]     Arrival date & time 11/19/14  0448 History   None    Chief Complaint  Patient presents with  . Rash  . Vaginitis     (Consider location/radiation/quality/duration/timing/severity/associated sxs/prior Treatment) HPI Comments: Pt comes in with c/o rash to face and neck and to upper extremities. No fever. She states that she was exposed to scabies. She states that she also has white and yellow discharge. No fever or abdominal pain. Hasn't tried anything on the rash. No vomiting and diarrhea.  The history is provided by the patient. No language interpreter was used.    History reviewed. No pertinent past medical history. Past Surgical History  Procedure Laterality Date  . Tonsillectomy     No family history on file. Social History  Substance Use Topics  . Smoking status: Current Every Day Smoker -- 0.50 packs/day    Types: Cigarettes  . Smokeless tobacco: None  . Alcohol Use: Yes     Comment: occaisional   OB History    No data available     Review of Systems  All other systems reviewed and are negative.     Allergies  Review of patient's allergies indicates no known allergies.  Home Medications   Prior to Admission medications   Medication Sig Start Date End Date Taking? Authorizing Provider  acetaminophen (PAIN RELIEVER) 325 MG tablet Take 650 mg by mouth every 6 (six) hours as needed.    Historical Provider, MD  cephALEXin (KEFLEX) 500 MG capsule Take 1 capsule (500 mg total) by mouth 3 (three) times daily. 09/20/13   Mellody Drown, PA-C  naproxen (NAPROSYN) 500 MG tablet Take 1 tablet (500 mg total) by mouth 2 (two) times daily. 09/20/13   Lauren Parker, PA-C   BP 118/75 mmHg  Pulse 71  Temp(Src) 98.3 F (36.8 C) (Oral)  Resp 16  Ht  (1.753 m)  Wt 132 lb (59.875 kg)  BMI 19.48 kg/m2  SpO2 100%  LMP 11/11/2014 Physical Exam  Constitutional: She is oriented to person, place, and time. She appears well-developed and well-nourished.   Cardiovascular: Normal rate and regular rhythm.   Pulmonary/Chest: Effort normal and breath sounds normal.  Abdominal: Soft. Bowel sounds are normal. There is no tenderness.  Genitourinary:  White discharge.-cmt  Neurological: She is alert and oriented to person, place, and time. Coordination normal.  Skin:  Bug bites noted to the face and leg  Nursing note and vitals reviewed.   ED Course  Procedures (including critical care time) Labs Review Labs Reviewed  WET PREP, GENITAL - Abnormal; Notable for the following:    Yeast Wet Prep HPF POC FEW (*)    WBC, Wet Prep HPF POC TOO NUMEROUS TO COUNT (*)    All other components within normal limits  URINALYSIS, ROUTINE W REFLEX MICROSCOPIC (NOT AT [facility])  PREGNANCY, URINE  GC/CHLAMYDIA PROBE AMP ([place]) NOT AT [facility]    Imaging Review No results found. I have personally reviewed and evaluated these images and lab results as part of my medical decision-making.   EKG Interpretation None      MDM   Final diagnoses:  Bug bites  Candidiasis   Rash not consistent with scabies. Will treat for yeast. Discussed return precautions with pt    Teressa Lower, NP 11/19/14 4098  Tomasita Crumble, MD 11/19/14 1614

## 2014-11-19 NOTE — ED Notes (Signed)
Pelvic cart @ bedside.  

## 2014-11-19 NOTE — ED Notes (Signed)
Pt c/o itchiness and rash on her body, state she stay on a family member house that was dx with scabies, she is not able to sleep due to itchiness. Pt c/o having yeast infection too. Denies any fever or pain at this time.

## 2014-11-19 NOTE — ED Notes (Signed)
Pa bedside for pelvic exam with EMT.
